# Patient Record
Sex: Female | Born: 1990 | Race: White | Hispanic: No | Smoking: Former smoker
Health system: Southern US, Community
[De-identification: ages and names within clinical notes are randomized; demographics above are authoritative.]

## PROBLEM LIST (undated history)

## (undated) DIAGNOSIS — K8020 Calculus of gallbladder without cholecystitis without obstruction: Secondary | ICD-10-CM

## (undated) DIAGNOSIS — Z8489 Family history of other specified conditions: Secondary | ICD-10-CM

## (undated) DIAGNOSIS — S39012A Strain of muscle, fascia and tendon of lower back, initial encounter: Secondary | ICD-10-CM

## (undated) HISTORY — DX: Family history of other specified conditions: Z84.89

---

## 2009-01-05 ENCOUNTER — Emergency Department (HOSPITAL_COMMUNITY): Admission: EM | Admit: 2009-01-05 | Discharge: 2009-01-05 | Payer: Self-pay | Admitting: Family Medicine

## 2009-09-30 ENCOUNTER — Emergency Department (HOSPITAL_COMMUNITY): Admission: EM | Admit: 2009-09-30 | Discharge: 2009-09-30 | Payer: Self-pay | Admitting: Emergency Medicine

## 2010-07-15 ENCOUNTER — Other Ambulatory Visit: Payer: Self-pay | Admitting: Family Medicine

## 2010-07-15 ENCOUNTER — Encounter: Payer: Self-pay | Admitting: Family Medicine

## 2010-07-15 ENCOUNTER — Other Ambulatory Visit: Payer: Self-pay | Admitting: Internal Medicine

## 2010-07-15 ENCOUNTER — Ambulatory Visit (INDEPENDENT_AMBULATORY_CARE_PROVIDER_SITE_OTHER): Payer: 59 | Admitting: Family Medicine

## 2010-07-15 DIAGNOSIS — F411 Generalized anxiety disorder: Secondary | ICD-10-CM | POA: Insufficient documentation

## 2010-07-15 DIAGNOSIS — R1013 Epigastric pain: Secondary | ICD-10-CM

## 2010-07-15 DIAGNOSIS — R634 Abnormal weight loss: Secondary | ICD-10-CM

## 2010-07-16 LAB — CBC WITH DIFFERENTIAL/PLATELET
Basophils Absolute: 0 10*3/uL (ref 0.0–0.1)
Basophils Relative: 0.4 % (ref 0.0–3.0)
Lymphs Abs: 2.4 10*3/uL (ref 0.7–4.0)
MCV: 96.7 fl (ref 78.0–100.0)
Monocytes Absolute: 0.5 10*3/uL (ref 0.1–1.0)
Monocytes Relative: 9.6 % (ref 3.0–12.0)
Neutrophils Relative %: 40.1 % — ABNORMAL LOW (ref 43.0–77.0)
Platelets: 166 10*3/uL (ref 150.0–400.0)
RBC: 3.71 Mil/uL — ABNORMAL LOW (ref 3.87–5.11)
RDW: 12.4 % (ref 11.5–14.6)
WBC: 5.3 10*3/uL (ref 4.5–10.5)

## 2010-07-16 LAB — HEPATIC FUNCTION PANEL
AST: 19 U/L (ref 0–37)
Albumin: 4.1 g/dL (ref 3.5–5.2)
Total Protein: 6.3 g/dL (ref 6.0–8.3)

## 2010-07-16 LAB — BASIC METABOLIC PANEL
Calcium: 9 mg/dL (ref 8.4–10.5)
Glucose, Bld: 78 mg/dL (ref 70–99)
Potassium: 3.6 mEq/L (ref 3.5–5.1)

## 2010-07-16 LAB — H. PYLORI ANTIBODY, IGG: H Pylori IgG: NEGATIVE

## 2010-07-17 ENCOUNTER — Encounter: Payer: Self-pay | Admitting: Family Medicine

## 2010-07-17 ENCOUNTER — Ambulatory Visit
Admission: RE | Admit: 2010-07-17 | Discharge: 2010-07-17 | Disposition: A | Discharge status: Home / Self Care | Payer: 59 | Source: Ambulatory Visit | Attending: Internal Medicine | Admitting: Internal Medicine

## 2010-07-17 ENCOUNTER — Other Ambulatory Visit: Payer: Self-pay | Admitting: Family Medicine

## 2010-07-17 DIAGNOSIS — R1013 Epigastric pain: Secondary | ICD-10-CM

## 2010-07-30 IMAGING — CR DG ABDOMEN 1V
1 series · 1 of 1 positions shown · non-contrast
Comparison: None

CLINICAL DATA: Bilateral flank pain for 1 day.  Hematuria.

ABDOMEN - 1 VIEW

[view not recorded]
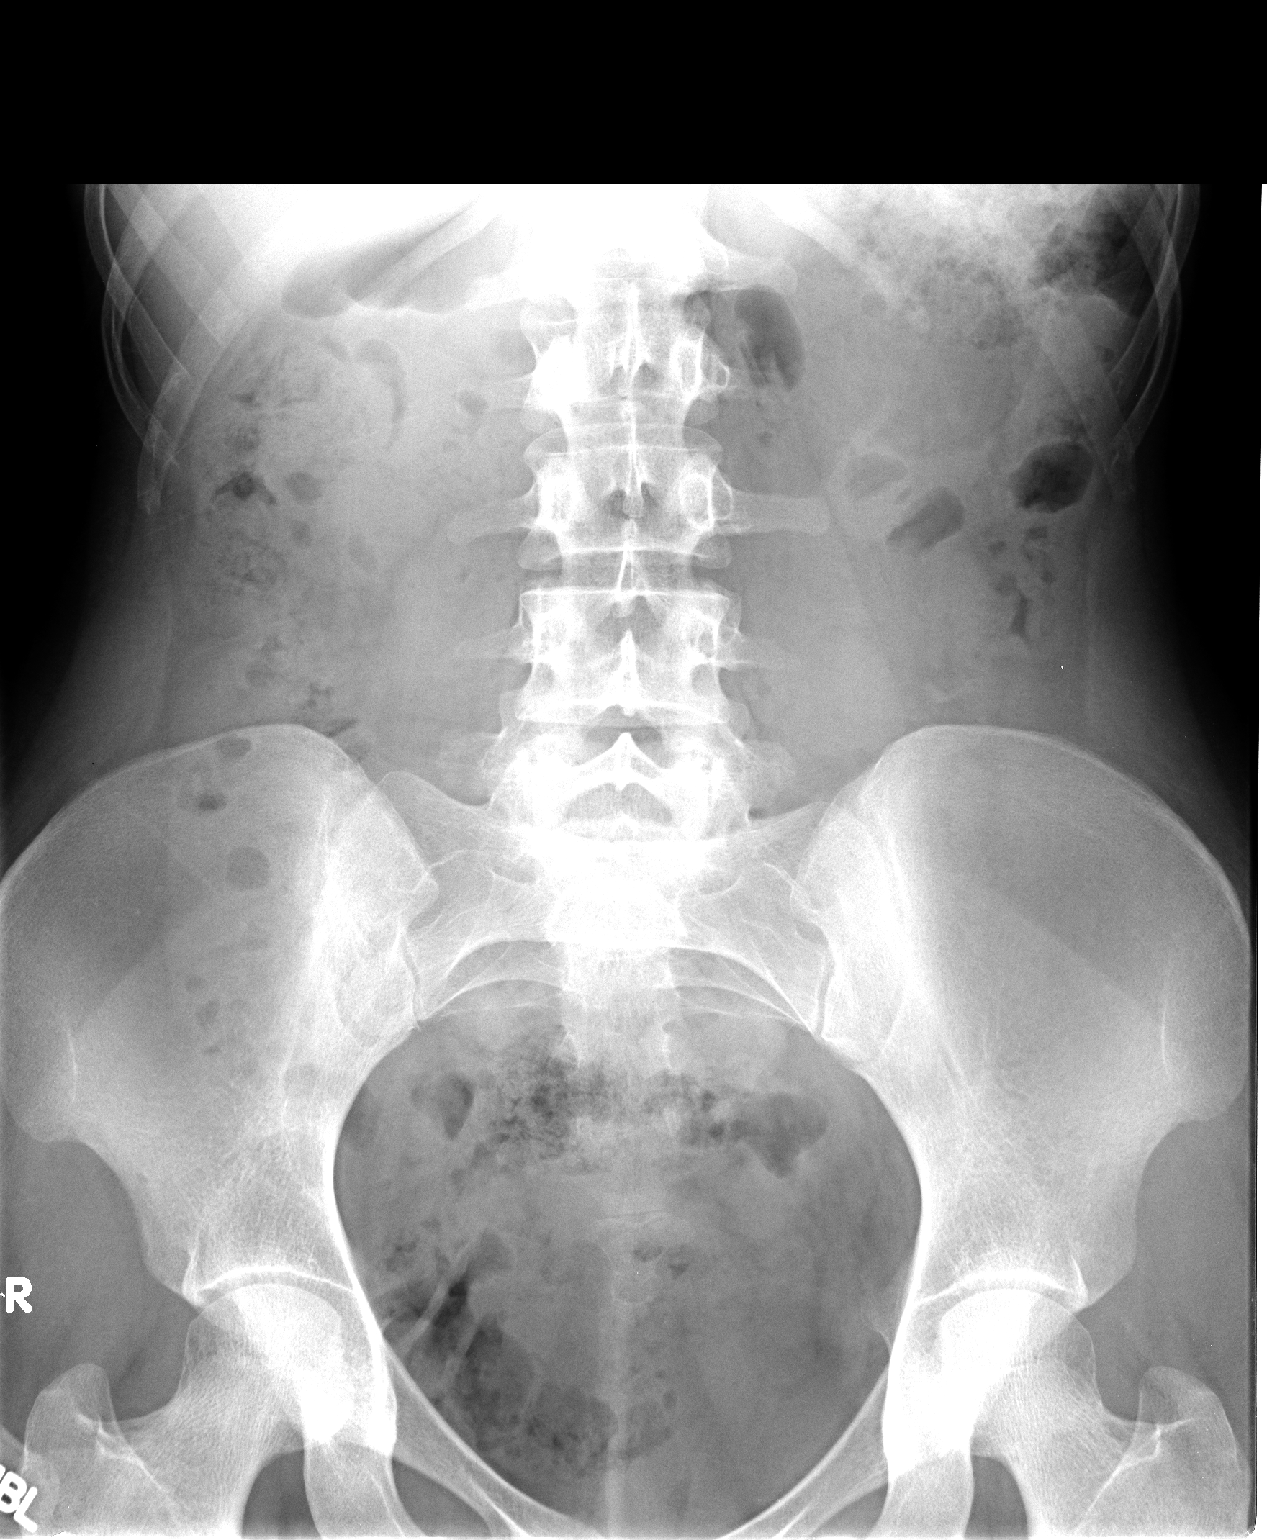

[1 of 1 positions shown; findings below may reference images not displayed]

FINDINGS: There are no visible renal or ureteral calculi.  The
bowel gas pattern is normal.  No bony abnormality.
IMPRESSION: Benign-appearing abdomen.

## 2010-07-30 NOTE — Assessment & Plan Note (Signed)
Summary: new to est/loosing weight/stomach pain/kn   Vital Signs:  Patient profile:   20 year old female Height:      66.50 inches Weight:      123 pounds BMI:     19.63 Pulse rate:   62 / minute BP sitting:   104 / 60  (left arm)  Vitals Entered By: Doristine Devoid CMA (July 15, 2010 3:33 PM) CC: NEW EST- stomach pain worse xweeks but intermittent x3-4 months some vomiting decrease appetite and weight loss    History of Present Illness: 19 yo woman here today to establish care.  previous [facility]- [facility]  reports for last year stress has been high.  for last 3-5 months is having abdominal pain under rib cage.  last week had 'an attack'- pain radiated through abd to back.  for last 2-3 weeks pain has been constant.  pain will worsen w/ anxiety.  having difficulty eating.  + nausea, vomiting in the middle of the night.  no diarrhea.  + palpitations.  has lump in throat- 'like a constant urge to vomit'.  will have 1 meal daily w/ some snacks.  was 145 lbs 4 months ago.  Preventive Screening-Counseling & Management  Alcohol-Tobacco     Alcohol drinks/day: <1     Smoking Status: current     Smoking Cessation Counseling: yes     Smoke Cessation Stage: contemplative     Packs/Day: 0.25  Caffeine-Diet-Exercise     Does Patient Exercise: no      Sexual History:  currently monogamous.        Drug Use:  marijuana.    Current Medications (verified): 1)  Buspirone Hcl 5 Mg Tabs (Buspirone Hcl) .... 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 Lungs:  Normal respiratory effort, chest expands symmetrically. Lungs are clear to auscultation, no crackles or wheezes. Heart:  Normal rate and regular rhythm. S1 and S2 normal without gallop, murmur, click, rub or other extra sounds. Abdomen:  soft, NT/ND, +BS, no rebound, guarding Pulses:  +2 carotid, radial, DP Extremities:  no C/C/E Neurologic:  alert & oriented X3, cranial nerves II-XII intact, strength normal in all extremities, sensation intact to light touch, gait normal, and DTRs symmetrical and normal.   Cervical Nodes:  No lymphadenopathy noted Psych:  Oriented X3, memory intact for recent and remote, normally interactive, good eye contact, not anxious appearing, and not depressed appearing.     Impression & Recommendations:  Problem # 1:  ABDOMINAL PAIN, EPIGASTRIC (ICD-789.06) Assessment New pt w/ epigastric abdominal pain and wt loss.  check CBC to r/o infxn, CMP to r/o electrolyte or hepatic involvement, H pylori to r/o PUD, and ESR to r/o IBD.  encouraged pt to try and eat regularly to avoid stomach being empty.  start PPI.  reviewed supportive care and red flags that should prompt return.  Pt expresses understanding and is in agreement w/ this plan. Orders: Venipuncture (78295) Radiology Referral (  Radiology) Specimen Handling ([id number]) TLB-CBC Platelet - w/Differential (85025-CBCD) TLB-BMP (Basic Metabolic Panel-BMET) (80048-METABOL) TLB-Hepatic/Liver Function Pnl (80076-HEPATIC) TLB-H. Pylori Abs(Helicobacter Pylori) (86677-HELICO) TLB-Sedimentation Rate (ESR) (85652-ESR)  Problem # 2:  WEIGHT LOSS (ICD-783.21) Assessment: New pt has 20+ lbs of unintentional weight loss.  denies eating disorder.  admits to increased stress recently.  check labs as above.  Problem # 3:  ANXIETY (ICD-300.00) Assessment: New hx of  anxiety, has difficult relationship w/ parents.  may be contributing to nausea.  will follow. Her updated medication list for this problem includes:    Buspirone Hcl 5 Mg Tabs (Buspirone hcl) .Marland Kitchen... Take one tablet daily  Complete Medication List: 1)  Buspirone Hcl 5 Mg Tabs (Buspirone hcl) .... Take one tablet daily  Patient Instructions: 1)  Please schedule a follow-up appointment in 3-4 weeks to recheck abdominal pain and weight loss. 2)  We'll notify you of your lab results 3)  Start the Nexium daily 4)  Try and eat more regularly to prevent your stomach from being empty 5)  We'll call you with your ultrasound appt 6)  Call with any questions or concerns 7)  Hang in there!    Orders Added: 1)  Venipuncture [14782] 2)  Radiology Referral [Radiology] 3)  Specimen Handling [99000] 4)  TLB-CBC Platelet - w/Differential [85025-CBCD] 5)  TLB-BMP (Basic Metabolic Panel-BMET) [80048-METABOL] 6)  TLB-Hepatic/Liver Function Pnl [80076-HEPATIC] 7)  TLB-H. Pylori Abs(Helicobacter Pylori) [86677-HELICO] 8)  TLB-Sedimentation Rate (ESR) [85652-ESR] 9)  New Patient Level III [95621]

## 2010-08-05 ENCOUNTER — Ambulatory Visit (INDEPENDENT_AMBULATORY_CARE_PROVIDER_SITE_OTHER): Payer: 59 | Admitting: Family Medicine

## 2010-08-05 ENCOUNTER — Encounter: Payer: Self-pay | Admitting: Family Medicine

## 2010-08-05 DIAGNOSIS — Z3009 Encounter for other general counseling and advice on contraception: Secondary | ICD-10-CM

## 2010-08-05 DIAGNOSIS — R1013 Epigastric pain: Secondary | ICD-10-CM

## 2010-08-05 DIAGNOSIS — R634 Abnormal weight loss: Secondary | ICD-10-CM

## 2010-08-11 NOTE — Assessment & Plan Note (Signed)
Summary: 3-4 week follow up/cbs   Vital Signs:  Patient profile:   20 year old female Weight:      127 pounds BMI:     20.26 Pulse rate:   66 / minute BP sitting:   112 / 70  (left arm)  Vitals Entered By: Doristine Devoid CMA (August 05, 2010 3:45 PM) CC: f/u nexium helps when taking w/ food    History of Present Illness: 20 yo woman here today for f/u on nausea, abdominal pain.  sxs improve when taking Nexium- will occasionaly forget.  trying to eat more regularly.  no diarrhea, vomiting.  admits to increased stress w/ school.  has gained weight since previous visit.  is making conscious effort to eat regularly.  stopped birth control a few months ago when she was having abdominal pain.  would like to restart.  was on Loestrin 24 Fe.  LMP- 2/9.  Current Medications (verified): 1)  Buspirone Hcl 5 Mg Tabs (Buspirone Hcl) .... Take One Tablet Daily  Allergies (verified): No Known Drug Allergies  Review of Systems      See HPI  Physical Exam  General:  thin, no acute distress Abdomen:  soft, NT/ND, +BS, no rebound, guarding Psych:  Oriented X3, memory intact for recent and remote, normally interactive, good eye contact, not anxious appearing, and not depressed appearing.     Impression & Recommendations:  Problem # 1:  ABDOMINAL PAIN, EPIGASTRIC (ICD-789.06) Assessment Improved better since starting PPI.  script given for Omeprazole.  Problem # 2:  WEIGHT LOSS (BMW-413.24) Assessment: Unchanged pt has gained 4 lbs- this may be clothing variation but it is encouraging that she has not continued to lose weight.  will continue to follow.  Problem # 3:  CONTRACEPTIVE MANAGEMENT (ICD-V25.09) Assessment: New script given to restart OCPs.  instructed on proper start date and importance of back up method until meds fully in pt's sxs.  Pt expresses understanding and is in agreement w/ this plan.  Complete Medication List: 1)  Buspirone Hcl 5 Mg Tabs (Buspirone hcl) .... Take  one tablet daily 2)  Omeprazole 40 Mg Cpdr (Omeprazole) .Marland Kitchen.. 1 tab by mouth daily 3)  Loestrin 24 Fe 1-20 Mg-mcg Tabs (Norethin ace-eth estrad-fe) .Marland Kitchen.. 1 tab daily as directed  Patient Instructions: 1)  Whenever your schedule lightens- schedule your physical 2)  I'm so glad you are feeling somewhat better 3)  Keep trying to eat regularly 4)  Continue the Nexium/Omeprazole daily 5)  Start the Loestrin the Sunday after you start bleeding- use condoms for at least the 1st 2 months 6)  Call with any questions or concerns 7)  Hang in there! Prescriptions: LOESTRIN 24 FE 1-20 MG-MCG TABS (NORETHIN ACE-ETH ESTRAD-FE) 1 tab daily as directed  #1 pack x 11   Entered and Authorized by:   Aaleeyah Bias MD   Signed by:   Bolden Hagerman MD on 08/05/2010   Method used:   Electronically to        [facility] [address] # [id number]* (retail)       [address]       [place], [place]  [address]       Ph: [phone]       Fax: [phone]   RxID:   [id number] OMEPRAZOLE 40 MG CPDR (OMEPRAZOLE) 1 tab by mouth daily  #30 x 3   Entered and Authorized by:   Neena Rhymes MD   Signed by:   Neena Rhymes MD on 08/05/2010   Method  used:   Electronically to        [facility] # [phone]* (retail)       [address]       [place], [place]  [address]       Ph: [phone]       Fax: [phone]   RxID:   [id number]    Orders Added: 1)  Est. Patient Level IV [51884]

## 2010-09-20 LAB — POCT URINALYSIS DIP (DEVICE)
Bilirubin Urine: NEGATIVE
Ketones, ur: NEGATIVE mg/dL
Nitrite: NEGATIVE
Protein, ur: NEGATIVE mg/dL
Urobilinogen, UA: 0.2 mg/dL (ref 0.0–1.0)

## 2010-09-20 LAB — GC/CHLAMYDIA PROBE AMP, GENITAL
Chlamydia, DNA Probe: NEGATIVE
GC Probe Amp, Genital: NEGATIVE

## 2010-09-20 LAB — WET PREP, GENITAL
Trich, Wet Prep: NONE SEEN
Yeast Wet Prep HPF POC: NONE SEEN

## 2010-09-20 LAB — POCT PREGNANCY, URINE: Preg Test, Ur: NEGATIVE
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
## 2011-07-22 ENCOUNTER — Encounter (HOSPITAL_COMMUNITY): Payer: Self-pay | Admitting: Emergency Medicine

## 2011-07-22 ENCOUNTER — Emergency Department (HOSPITAL_COMMUNITY)
Admission: EM | Admit: 2011-07-22 | Discharge: 2011-07-22 | Disposition: A | Discharge status: Home / Self Care | Payer: 59 | Source: Home / Self Care | Attending: Emergency Medicine | Admitting: Emergency Medicine

## 2011-07-22 DIAGNOSIS — S39012A Strain of muscle, fascia and tendon of lower back, initial encounter: Secondary | ICD-10-CM

## 2011-07-22 HISTORY — DX: Strain of muscle, fascia and tendon of lower back, initial encounter: S39.012A

## 2011-07-22 HISTORY — DX: Calculus of gallbladder without cholecystitis without obstruction: K80.20

## 2011-07-22 LAB — POCT URINALYSIS DIP (DEVICE)
Bilirubin Urine: NEGATIVE
Ketones, ur: NEGATIVE mg/dL
Protein, ur: NEGATIVE mg/dL
Urobilinogen, UA: 1 mg/dL (ref 0.0–1.0)

## 2011-07-22 LAB — POCT PREGNANCY, URINE: Preg Test, Ur: NEGATIVE

## 2011-07-22 MED ORDER — HYDROCODONE-ACETAMINOPHEN 5-325 MG PO TABS: 2.0000 | ORAL_TABLET | ORAL | Status: AC | PRN

## 2011-07-22 MED ORDER — PREDNISONE 20 MG PO TABS: ORAL_TABLET | ORAL | Status: AC

## 2011-07-22 MED ORDER — METHOCARBAMOL 500 MG PO TABS: 500.0000 mg | ORAL_TABLET | Freq: Four times a day (QID) | ORAL | Status: AC

## 2011-07-22 MED ORDER — IBUPROFEN 600 MG PO TABS: 600.0000 mg | ORAL_TABLET | Freq: Four times a day (QID) | ORAL | Status: AC | PRN

## 2011-07-22 NOTE — ED Notes (Signed)
PT HERE WITH SEVERE CONSTANT SHARP LOWER BACK PAIN,NONRADIATING SINCE THIS AM.PT STATES I WENT TO BEND DOWN AND COULDN'T GET UP.NO INJURY BUT PT HAS HX GALLSTONES.URINE SPECIMEN COLLECTED.VSS.PT IS CURRENTLY ON MENSTRUAL
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
## 2016-06-16 ENCOUNTER — Emergency Department (HOSPITAL_COMMUNITY)
Admission: EM | Admit: 2016-06-16 | Discharge: 2016-06-16 | Disposition: A | Discharge status: Home / Self Care | Payer: Self-pay | Attending: Emergency Medicine | Admitting: Emergency Medicine

## 2016-06-16 ENCOUNTER — Emergency Department (HOSPITAL_COMMUNITY): Payer: Self-pay

## 2016-06-16 ENCOUNTER — Encounter (HOSPITAL_COMMUNITY): Payer: Self-pay

## 2016-06-16 DIAGNOSIS — R102 Pelvic and perineal pain: Secondary | ICD-10-CM | POA: Insufficient documentation

## 2016-06-16 DIAGNOSIS — F17210 Nicotine dependence, cigarettes, uncomplicated: Secondary | ICD-10-CM | POA: Insufficient documentation

## 2016-06-16 LAB — URINALYSIS, ROUTINE W REFLEX MICROSCOPIC
BILIRUBIN URINE: NEGATIVE
GLUCOSE, UA: NEGATIVE mg/dL
Ketones, ur: NEGATIVE mg/dL
Leukocytes, UA: NEGATIVE
NITRITE: NEGATIVE
PH: 7 (ref 5.0–8.0)
Protein, ur: NEGATIVE mg/dL
SPECIFIC GRAVITY, URINE: 1.003 — AB (ref 1.005–1.030)

## 2016-06-16 LAB — WET PREP, GENITAL
CLUE CELLS WET PREP: NONE SEEN
SPERM: NONE SEEN
TRICH WET PREP: NONE SEEN
Yeast Wet Prep HPF POC: NONE SEEN

## 2016-06-16 LAB — COMPREHENSIVE METABOLIC PANEL
ALT: 16 U/L (ref 14–54)
AST: 18 U/L (ref 15–41)
Albumin: 4.4 g/dL (ref 3.5–5.0)
Alkaline Phosphatase: 50 U/L (ref 38–126)
Anion gap: 7 (ref 5–15)
BUN: 9 mg/dL (ref 6–20)
CHLORIDE: 107 mmol/L (ref 101–111)
CO2: 24 mmol/L (ref 22–32)
Calcium: 9.1 mg/dL (ref 8.9–10.3)
Creatinine, Ser: 0.58 mg/dL (ref 0.44–1.00)
Glucose, Bld: 107 mg/dL — ABNORMAL HIGH (ref 65–99)
POTASSIUM: 3.8 mmol/L (ref 3.5–5.1)
SODIUM: 138 mmol/L (ref 135–145)
Total Bilirubin: 0.6 mg/dL (ref 0.3–1.2)
Total Protein: 7.1 g/dL (ref 6.5–8.1)

## 2016-06-16 LAB — CBC
HCT: 41.2 % (ref 36.0–46.0)
HEMOGLOBIN: 14 g/dL (ref 12.0–15.0)
MCH: 32.7 pg (ref 26.0–34.0)
MCHC: 34 g/dL (ref 30.0–36.0)
MCV: 96.3 fL (ref 78.0–100.0)
PLATELETS: 289 10*3/uL (ref 150–400)
RBC: 4.28 MIL/uL (ref 3.87–5.11)
RDW: 12.5 % (ref 11.5–15.5)
WBC: 9.3 10*3/uL (ref 4.0–10.5)

## 2016-06-16 LAB — I-STAT BETA HCG BLOOD, ED (MC, WL, AP ONLY)

## 2016-06-16 LAB — LIPASE, BLOOD: LIPASE: 24 U/L (ref 11–51)

## 2016-06-16 MED ORDER — KETOROLAC TROMETHAMINE 30 MG/ML IJ SOLN
30.0000 mg | Freq: Once | INTRAMUSCULAR | Status: AC
  Administered 2016-06-16: 30 mg via INTRAVENOUS
  Filled 2016-06-16: qty 1

## 2016-06-16 MED ORDER — MORPHINE SULFATE (PF) 2 MG/ML IV SOLN
2.0000 mg | Freq: Once | INTRAVENOUS | Status: AC
Start: 2016-06-16 — End: 2016-06-16
  Administered 2016-06-16: 2 mg via INTRAVENOUS
  Filled 2016-06-16: qty 1

## 2016-06-16 MED ORDER — ONDANSETRON HCL 4 MG/2ML IJ SOLN
4.0000 mg | Freq: Once | INTRAMUSCULAR | Status: AC
  Administered 2016-06-16: 4 mg via INTRAVENOUS
  Filled 2016-06-16: qty 2

## 2016-06-16 NOTE — ED Notes (Addendum)
EDPA Provider at bedside. PELVIC. ASSISTED BY Mardene CelesteJOANNA NT

## 2016-06-16 NOTE — Discharge Instructions (Signed)
For pain control please take ibuprofen (also known as Motrin or Advil) 800mg  (this is normally 4 over the counter pills) 3 times a day  for 5 days. Take with food to minimize stomach irritation.  Do not hesitate to return to the emergency department for any new, worsening or concerning symptoms.

## 2016-06-16 NOTE — ED Notes (Signed)
EDPA Provider at bedside. 

## 2016-06-16 NOTE — ED Triage Notes (Signed)
Pt c/o LLQ pain, nausea, and increased "urge to have a BM" starting yesterday.  Pain score 6/10.  Last BM this morning and currently on menstrual period.  Sts "I typically have bad cramping, but this feels different."  Denies GU complaints.

## 2016-06-16 NOTE — ED Notes (Signed)
RETURNED FROM ULTRASOUND
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
## 2016-06-17 LAB — GC/CHLAMYDIA PROBE AMP (~~LOC~~) NOT AT ARMC
CHLAMYDIA, DNA PROBE: NEGATIVE
Neisseria Gonorrhea: NEGATIVE

## 2016-06-17 LAB — HIV ANTIBODY (ROUTINE TESTING W REFLEX): HIV Screen 4th Generation wRfx: NONREACTIVE

## 2016-06-17 LAB — RPR: RPR Ser Ql: NONREACTIVE

## 2016-06-22 ENCOUNTER — Other Ambulatory Visit: Payer: Self-pay | Admitting: Obstetrics & Gynecology

## 2017-01-27 IMAGING — US US TRANSVAGINAL NON-OB
1 series · 13 of 25 positions shown · non-contrast
Comparison: None.

CLINICAL DATA: Pelvic pain.

EXAM:
TRANSABDOMINAL AND TRANSVAGINAL ULTRASOUND OF PELVIS
DOPPLER ULTRASOUND OF OVARIES
TECHNIQUE: Both transabdominal and transvaginal ultrasound examinations of the
pelvis were performed. Transabdominal technique was performed for
global imaging of the pelvis including uterus, ovaries, adnexal
regions, and pelvic cul-de-sac.
It was necessary to proceed with endovaginal exam following the
transabdominal exam to visualize the endometrium and ovaries. Color
and duplex Doppler ultrasound was utilized to evaluate blood flow to
the ovaries.

[Series 1: us transvaginal non-ob · 0.24mm/px · 13 of 90 slices shown]
[im 1/90]
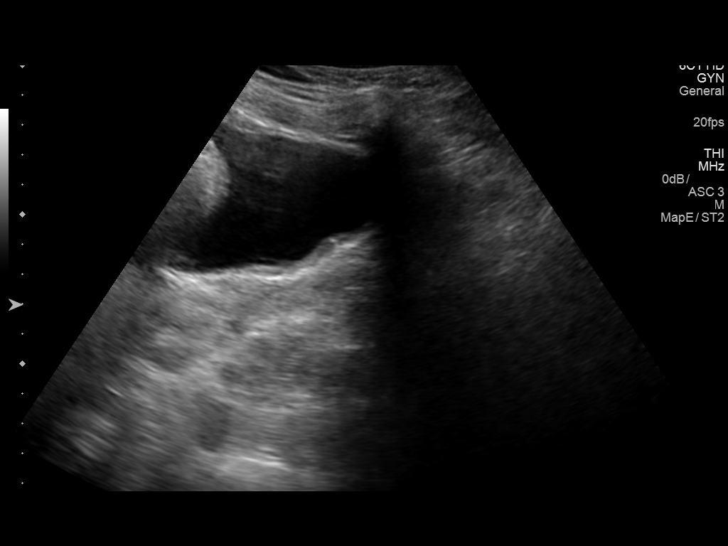
[im 8/90]
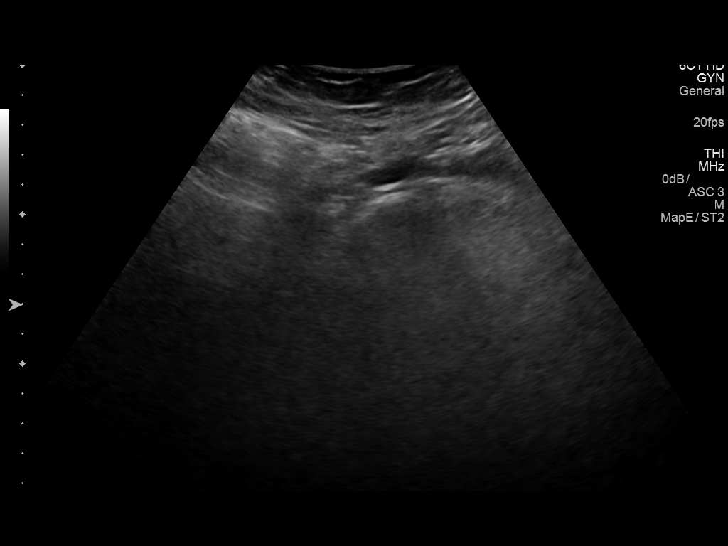
[im 15/90]
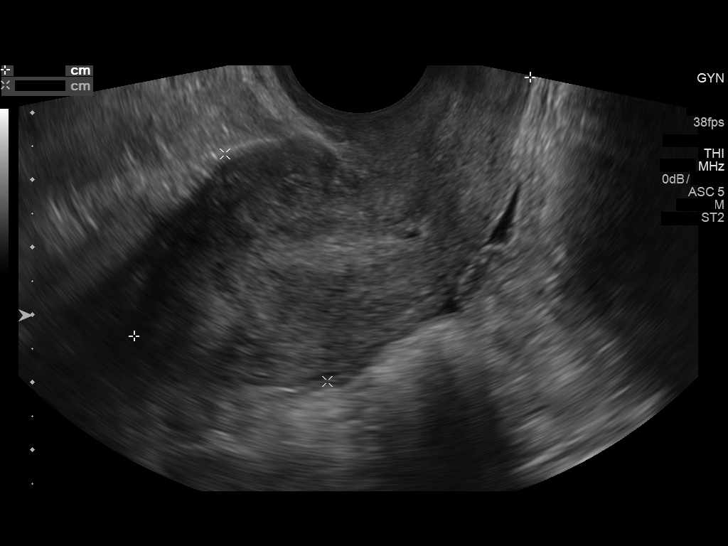
[im 23/90]
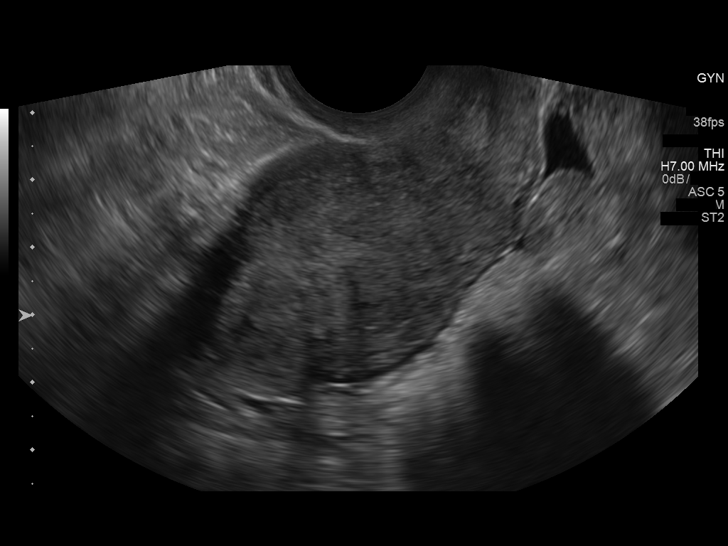
[im 30/90]
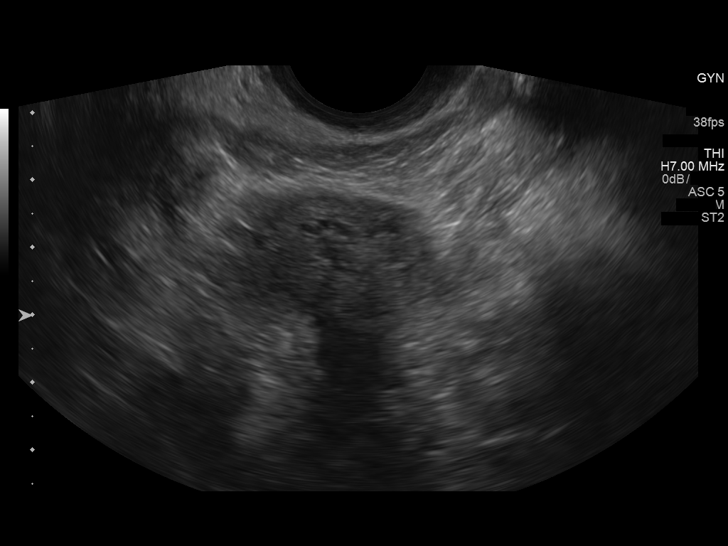
[im 38/90]
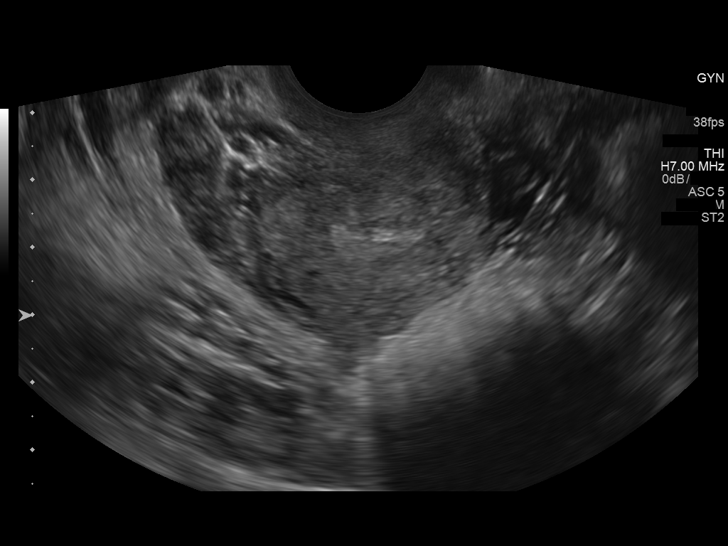
[im 45/90]
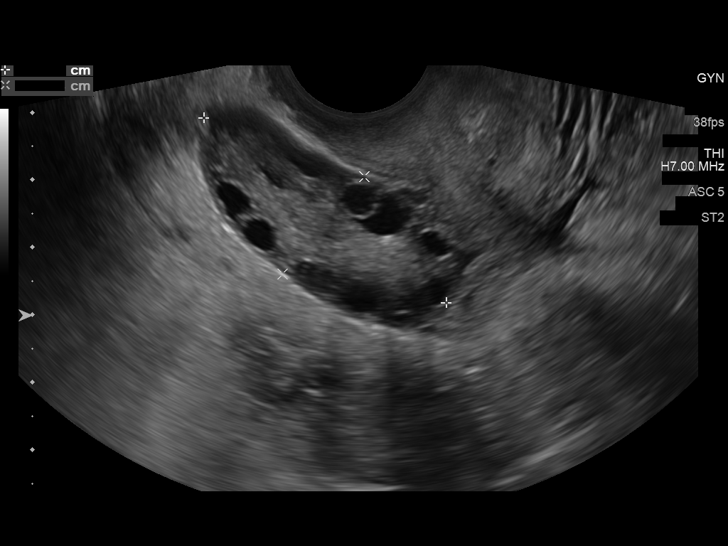
[im 52/90]
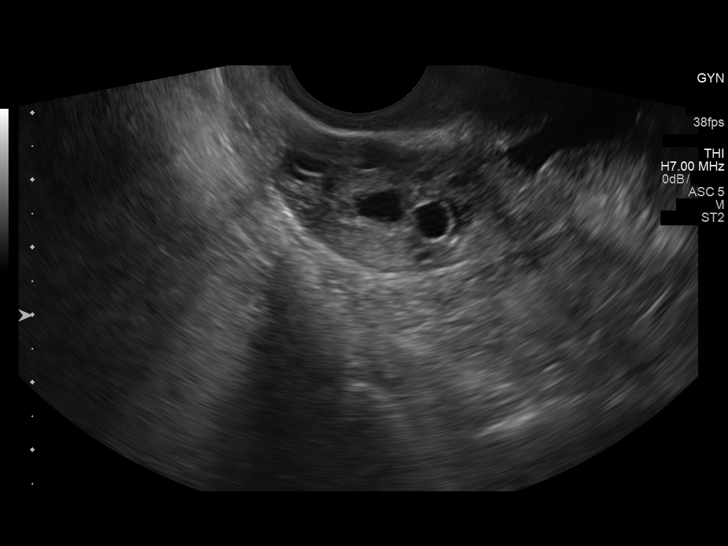
[im 60/90]
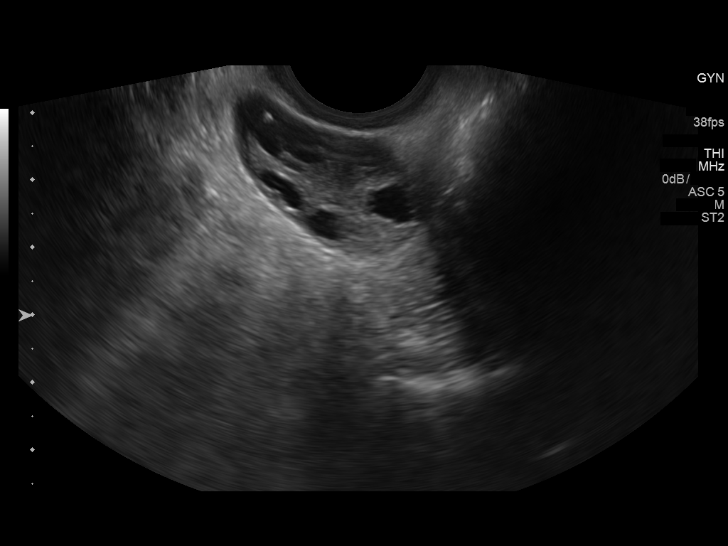
[im 67/90]
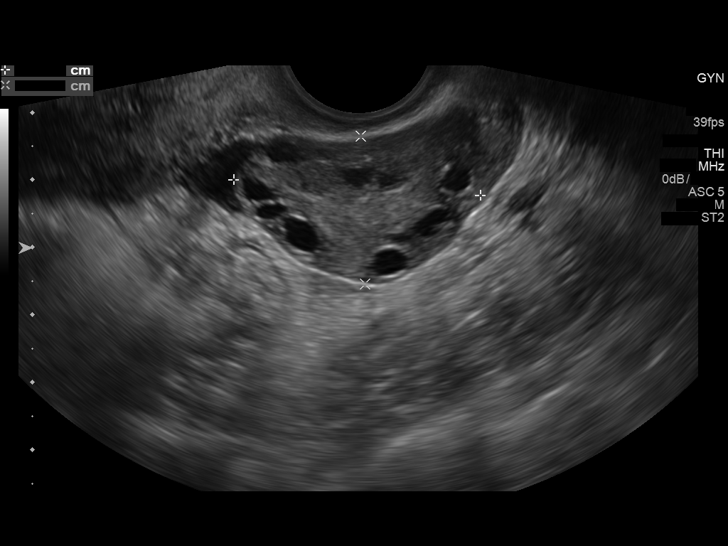
[im 75/90]
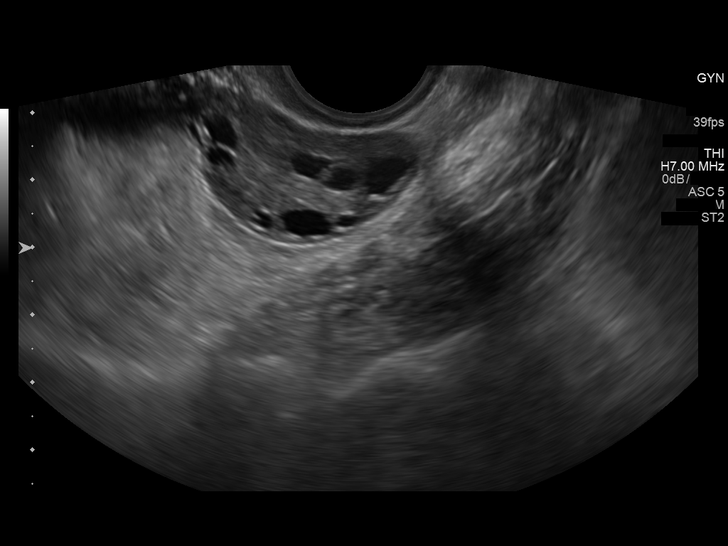
[im 82/90]
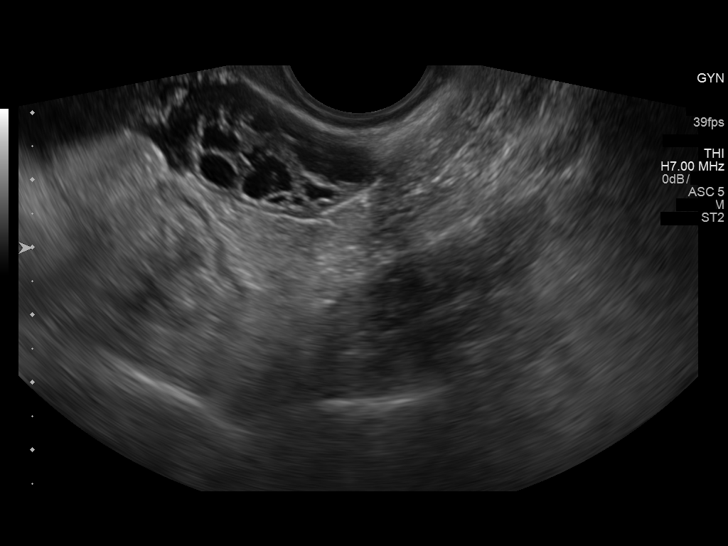
[im 90/90]
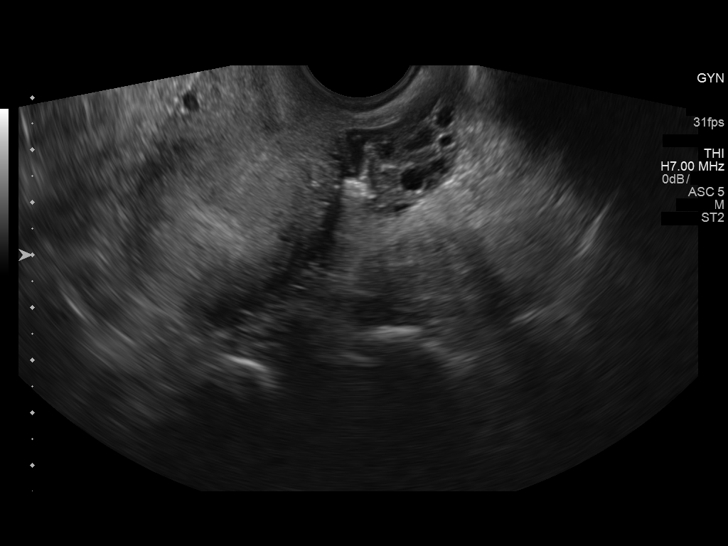

[13 of 25 positions shown; findings below may reference images not displayed]

FINDINGS: Uterus

Measurements: 7 x 3.7 x 4.4 cm. 1.6 x 2 x 2.1 cm heterogeneous
uterine fundal mass most consistent with a uterine leiomyoma.

Endometrium

Thickness: 4.7 mm.  No focal abnormality visualized.

Right ovary

Measurements: 4.5 x 1.9 x 2.3 cm. Normal appearance/no adnexal mass.

Left ovary

Measurements: 3.7 x 2.2 x 1.9 cm. Normal appearance/no adnexal mass.

Pulsed Doppler evaluation of both ovaries demonstrates normal
low-resistance arterial and venous waveforms.

Other findings

Small amount of pelvic free fluid likely physiologic.
IMPRESSION: 1. No evidence of ovarian torsion.
2. Small uterine fibroid.

## 2019-06-13 LAB — OB RESULTS CONSOLE ABO/RH: RH Type: POSITIVE

## 2019-06-13 LAB — OB RESULTS CONSOLE RPR: RPR: NONREACTIVE

## 2019-06-13 LAB — OB RESULTS CONSOLE ANTIBODY SCREEN: Antibody Screen: NEGATIVE

## 2019-06-13 LAB — OB RESULTS CONSOLE GC/CHLAMYDIA
Chlamydia: POSITIVE
Gonorrhea: NEGATIVE

## 2019-06-13 LAB — OB RESULTS CONSOLE HEPATITIS B SURFACE ANTIGEN: Hepatitis B Surface Ag: NEGATIVE

## 2019-06-13 LAB — OB RESULTS CONSOLE RUBELLA ANTIBODY, IGM: Rubella: IMMUNE

## 2019-06-13 LAB — OB RESULTS CONSOLE HIV ANTIBODY (ROUTINE TESTING): HIV: NONREACTIVE

## 2019-06-15 NOTE — L&D Delivery Note (Signed)
Patient was C/C/+2 and pushed for 24 minutes with epidural.    NSVD  female infant, Apgars 7,9, weight P.    <30 second shoulder dystocia relieved with suprapubic pressure and McRoberts.  The patient had bilateral labial and second degree perineal lacerations repaired with 3-0 and 2-0 Vicryl R. . Fundus was firm. EBL was expected amount. Placenta was delivered intact. Vagina was clear.  Delayed cord clamping done for 30-60 seconds while warming baby. Baby was vigorous and doing skin to skin with mother.  Loney Laurence

## 2019-07-11 LAB — OB RESULTS CONSOLE GC/CHLAMYDIA: Chlamydia: NEGATIVE

## 2019-12-04 LAB — OB RESULTS CONSOLE GBS: GBS: NEGATIVE

## 2019-12-31 ENCOUNTER — Telehealth (HOSPITAL_COMMUNITY): Payer: Self-pay | Admitting: *Deleted

## 2019-12-31 ENCOUNTER — Encounter (HOSPITAL_COMMUNITY): Payer: Self-pay | Admitting: *Deleted

## 2019-12-31 NOTE — Telephone Encounter (Signed)
Preadmission screen  

## 2020-01-01 ENCOUNTER — Other Ambulatory Visit: Payer: Self-pay | Admitting: Obstetrics and Gynecology

## 2020-01-01 ENCOUNTER — Encounter (HOSPITAL_COMMUNITY): Payer: Self-pay | Admitting: *Deleted

## 2020-01-01 ENCOUNTER — Telehealth (HOSPITAL_COMMUNITY): Payer: Self-pay | Admitting: *Deleted

## 2020-01-01 NOTE — Telephone Encounter (Signed)
Preadmission screen  

## 2020-01-02 ENCOUNTER — Other Ambulatory Visit (HOSPITAL_COMMUNITY)
Admission: RE | Admit: 2020-01-02 | Discharge: 2020-01-02 | Disposition: A | Discharge status: Home / Self Care | Payer: BLUE CROSS/BLUE SHIELD | Source: Ambulatory Visit | Attending: Obstetrics and Gynecology | Admitting: Obstetrics and Gynecology

## 2020-01-02 DIAGNOSIS — Z01812 Encounter for preprocedural laboratory examination: Secondary | ICD-10-CM | POA: Insufficient documentation

## 2020-01-02 DIAGNOSIS — Z20822 Contact with and (suspected) exposure to covid-19: Secondary | ICD-10-CM | POA: Insufficient documentation

## 2020-01-02 LAB — SARS CORONAVIRUS 2 (TAT 6-24 HRS): SARS Coronavirus 2: NEGATIVE

## 2020-01-04 ENCOUNTER — Inpatient Hospital Stay (HOSPITAL_COMMUNITY)
Admission: RE | Admit: 2020-01-04 | Discharge: 2020-01-05 | DRG: 807 | Disposition: A | Discharge status: Home / Self Care | Payer: BLUE CROSS/BLUE SHIELD | Attending: Obstetrics and Gynecology | Admitting: Obstetrics and Gynecology

## 2020-01-04 ENCOUNTER — Encounter (HOSPITAL_COMMUNITY): Payer: Self-pay | Admitting: Obstetrics and Gynecology

## 2020-01-04 ENCOUNTER — Other Ambulatory Visit: Payer: Self-pay

## 2020-01-04 ENCOUNTER — Inpatient Hospital Stay (HOSPITAL_COMMUNITY): Payer: BLUE CROSS/BLUE SHIELD | Admitting: Anesthesiology

## 2020-01-04 ENCOUNTER — Inpatient Hospital Stay (HOSPITAL_COMMUNITY): Payer: BLUE CROSS/BLUE SHIELD

## 2020-01-04 DIAGNOSIS — O480 Post-term pregnancy: Secondary | ICD-10-CM | POA: Diagnosis present

## 2020-01-04 DIAGNOSIS — Z87891 Personal history of nicotine dependence: Secondary | ICD-10-CM | POA: Diagnosis not present

## 2020-01-04 DIAGNOSIS — Z3A41 41 weeks gestation of pregnancy: Secondary | ICD-10-CM | POA: Diagnosis not present

## 2020-01-04 DIAGNOSIS — Z20822 Contact with and (suspected) exposure to covid-19: Secondary | ICD-10-CM | POA: Diagnosis present

## 2020-01-04 LAB — CBC
HCT: 36.4 % (ref 36.0–46.0)
Hemoglobin: 12 g/dL (ref 12.0–15.0)
MCH: 33.2 pg (ref 26.0–34.0)
MCHC: 33 g/dL (ref 30.0–36.0)
MCV: 100.8 fL — ABNORMAL HIGH (ref 80.0–100.0)
Platelets: 272 10*3/uL (ref 150–400)
RBC: 3.61 MIL/uL — ABNORMAL LOW (ref 3.87–5.11)
RDW: 12.9 % (ref 11.5–15.5)
WBC: 14.7 10*3/uL — ABNORMAL HIGH (ref 4.0–10.5)
nRBC: 0 % (ref 0.0–0.2)

## 2020-01-04 LAB — TYPE AND SCREEN
ABO/RH(D): A POS
Antibody Screen: NEGATIVE

## 2020-01-04 LAB — ABO/RH: ABO/RH(D): A POS

## 2020-01-04 LAB — RPR: RPR Ser Ql: NONREACTIVE

## 2020-01-04 MED ORDER — OXYCODONE-ACETAMINOPHEN 5-325 MG PO TABS: 2.0000 | ORAL_TABLET | ORAL | Status: DC | PRN

## 2020-01-04 MED ORDER — SODIUM CHLORIDE (PF) 0.9 % IJ SOLN
INTRAMUSCULAR | Status: DC | PRN
  Administered 2020-01-04: 12 mL/h via EPIDURAL

## 2020-01-04 MED ORDER — ACETAMINOPHEN 325 MG PO TABS
650.0000 mg | ORAL_TABLET | ORAL | Status: DC | PRN
  Administered 2020-01-05 (×3): 650 mg via ORAL
  Filled 2020-01-04 (×3): qty 2

## 2020-01-04 MED ORDER — EPHEDRINE 5 MG/ML INJ: 10.0000 mg | INTRAVENOUS | Status: DC | PRN

## 2020-01-04 MED ORDER — SODIUM CHLORIDE 0.9% FLUSH: 3.0000 mL | INTRAVENOUS | Status: DC | PRN

## 2020-01-04 MED ORDER — LIDOCAINE HCL (PF) 1 % IJ SOLN: 30.0000 mL | INTRAMUSCULAR | Status: DC | PRN

## 2020-01-04 MED ORDER — FENTANYL-BUPIVACAINE-NACL 0.5-0.125-0.9 MG/250ML-% EP SOLN
12.0000 mL/h | EPIDURAL | Status: DC | PRN
  Filled 2020-01-04: qty 250

## 2020-01-04 MED ORDER — PRENATAL MULTIVITAMIN CH
1.0000 | ORAL_TABLET | Freq: Every day | ORAL | Status: DC
  Filled 2020-01-04: qty 1

## 2020-01-04 MED ORDER — OXYTOCIN-SODIUM CHLORIDE 30-0.9 UT/500ML-% IV SOLN: 2.5000 [IU]/h | INTRAVENOUS | Status: DC

## 2020-01-04 MED ORDER — ZOLPIDEM TARTRATE 5 MG PO TABS: 5.0000 mg | ORAL_TABLET | Freq: Every evening | ORAL | Status: DC | PRN

## 2020-01-04 MED ORDER — BENZOCAINE-MENTHOL 20-0.5 % EX AERO
1.0000 "application " | INHALATION_SPRAY | CUTANEOUS | Status: DC | PRN
  Filled 2020-01-04: qty 56

## 2020-01-04 MED ORDER — LIDOCAINE HCL (PF) 1 % IJ SOLN
INTRAMUSCULAR | Status: DC | PRN
  Administered 2020-01-04 (×2): 4 mL via EPIDURAL

## 2020-01-04 MED ORDER — MEASLES, MUMPS & RUBELLA VAC IJ SOLR: 0.5000 mL | Freq: Once | INTRAMUSCULAR | Status: DC

## 2020-01-04 MED ORDER — LACTATED RINGERS IV SOLN: INTRAVENOUS | Status: DC

## 2020-01-04 MED ORDER — PHENYLEPHRINE 40 MCG/ML (10ML) SYRINGE FOR IV PUSH (FOR BLOOD PRESSURE SUPPORT): 80.0000 ug | PREFILLED_SYRINGE | INTRAVENOUS | Status: DC | PRN

## 2020-01-04 MED ORDER — TETANUS-DIPHTH-ACELL PERTUSSIS 5-2.5-18.5 LF-MCG/0.5 IM SUSP: 0.5000 mL | Freq: Once | INTRAMUSCULAR | Status: DC

## 2020-01-04 MED ORDER — LACTATED RINGERS IV SOLN: 500.0000 mL | INTRAVENOUS | Status: DC | PRN

## 2020-01-04 MED ORDER — OXYTOCIN BOLUS FROM INFUSION
333.0000 mL | Freq: Once | INTRAVENOUS | Status: AC
  Administered 2020-01-04: 333 mL via INTRAVENOUS

## 2020-01-04 MED ORDER — LORATADINE 10 MG PO TABS: 10.0000 mg | ORAL_TABLET | Freq: Every day | ORAL | Status: DC

## 2020-01-04 MED ORDER — DIPHENHYDRAMINE HCL 50 MG/ML IJ SOLN: 12.5000 mg | INTRAMUSCULAR | Status: DC | PRN

## 2020-01-04 MED ORDER — LACTATED RINGERS IV SOLN
500.0000 mL | Freq: Once | INTRAVENOUS | Status: AC
  Administered 2020-01-04: 500 mL via INTRAVENOUS

## 2020-01-04 MED ORDER — FLEET ENEMA 7-19 GM/118ML RE ENEM: 1.0000 | ENEMA | RECTAL | Status: DC | PRN

## 2020-01-04 MED ORDER — OXYTOCIN-SODIUM CHLORIDE 30-0.9 UT/500ML-% IV SOLN
1.0000 m[IU]/min | INTRAVENOUS | Status: DC
  Administered 2020-01-04: 2 m[IU]/min via INTRAVENOUS
  Filled 2020-01-04: qty 500

## 2020-01-04 MED ORDER — METHYLERGONOVINE MALEATE 0.2 MG PO TABS: 0.2000 mg | ORAL_TABLET | ORAL | Status: DC | PRN

## 2020-01-04 MED ORDER — MAGNESIUM HYDROXIDE 400 MG/5ML PO SUSP: 30.0000 mL | ORAL | Status: DC | PRN

## 2020-01-04 MED ORDER — SOD CITRATE-CITRIC ACID 500-334 MG/5ML PO SOLN
30.0000 mL | ORAL | Status: DC | PRN
  Administered 2020-01-04: 30 mL via ORAL
  Filled 2020-01-04: qty 30

## 2020-01-04 MED ORDER — SENNOSIDES-DOCUSATE SODIUM 8.6-50 MG PO TABS
2.0000 | ORAL_TABLET | ORAL | Status: DC
  Administered 2020-01-04: 2 via ORAL
  Filled 2020-01-04: qty 2

## 2020-01-04 MED ORDER — IBUPROFEN 800 MG PO TABS
800.0000 mg | ORAL_TABLET | Freq: Three times a day (TID) | ORAL | Status: DC
  Administered 2020-01-04 – 2020-01-05 (×3): 800 mg via ORAL
  Filled 2020-01-04 (×3): qty 1

## 2020-01-04 MED ORDER — COCONUT OIL OIL: 1.0000 "application " | TOPICAL_OIL | Status: DC | PRN

## 2020-01-04 MED ORDER — FERROUS SULFATE 325 (65 FE) MG PO TABS
325.0000 mg | ORAL_TABLET | Freq: Two times a day (BID) | ORAL | Status: DC
  Administered 2020-01-05 (×2): 325 mg via ORAL
  Filled 2020-01-04 (×2): qty 1

## 2020-01-04 MED ORDER — FLUTICASONE PROPIONATE 50 MCG/ACT NA SUSP
1.0000 | Freq: Every day | NASAL | Status: DC
  Filled 2020-01-04: qty 16

## 2020-01-04 MED ORDER — DIPHENHYDRAMINE HCL 25 MG PO CAPS: 25.0000 mg | ORAL_CAPSULE | Freq: Four times a day (QID) | ORAL | Status: DC | PRN

## 2020-01-04 MED ORDER — ONDANSETRON HCL 4 MG/2ML IJ SOLN
4.0000 mg | Freq: Four times a day (QID) | INTRAMUSCULAR | Status: DC | PRN
  Administered 2020-01-04: 4 mg via INTRAVENOUS
  Filled 2020-01-04: qty 2

## 2020-01-04 MED ORDER — TERBUTALINE SULFATE 1 MG/ML IJ SOLN: 0.2500 mg | Freq: Once | INTRAMUSCULAR | Status: DC | PRN

## 2020-01-04 MED ORDER — SODIUM CHLORIDE 0.9 % IV SOLN: 250.0000 mL | INTRAVENOUS | Status: DC | PRN

## 2020-01-04 MED ORDER — ONDANSETRON HCL 4 MG PO TABS: 4.0000 mg | ORAL_TABLET | ORAL | Status: DC | PRN

## 2020-01-04 MED ORDER — SIMETHICONE 80 MG PO CHEW: 80.0000 mg | CHEWABLE_TABLET | ORAL | Status: DC | PRN

## 2020-01-04 MED ORDER — ONDANSETRON HCL 4 MG/2ML IJ SOLN: 4.0000 mg | INTRAMUSCULAR | Status: DC | PRN

## 2020-01-04 MED ORDER — DIBUCAINE (PERIANAL) 1 % EX OINT: 1.0000 "application " | TOPICAL_OINTMENT | CUTANEOUS | Status: DC | PRN

## 2020-01-04 MED ORDER — WITCH HAZEL-GLYCERIN EX PADS: 1.0000 "application " | MEDICATED_PAD | CUTANEOUS | Status: DC | PRN

## 2020-01-04 MED ORDER — LACTATED RINGERS IV SOLN: 500.0000 mL | Freq: Once | INTRAVENOUS | Status: DC

## 2020-01-04 MED ORDER — OXYCODONE-ACETAMINOPHEN 5-325 MG PO TABS: 1.0000 | ORAL_TABLET | ORAL | Status: DC | PRN

## 2020-01-04 MED ORDER — METHYLERGONOVINE MALEATE 0.2 MG/ML IJ SOLN: 0.2000 mg | INTRAMUSCULAR | Status: DC | PRN

## 2020-01-04 MED ORDER — ACETAMINOPHEN 325 MG PO TABS: 650.0000 mg | ORAL_TABLET | ORAL | Status: DC | PRN

## 2020-01-04 MED ORDER — PANTOPRAZOLE SODIUM 40 MG PO TBEC
40.0000 mg | DELAYED_RELEASE_TABLET | Freq: Every day | ORAL | Status: DC
  Administered 2020-01-05: 40 mg via ORAL
  Filled 2020-01-04: qty 1

## 2020-01-04 MED ORDER — SODIUM CHLORIDE 0.9% FLUSH
3.0000 mL | Freq: Two times a day (BID) | INTRAVENOUS | Status: DC
  Administered 2020-01-04: 3 mL via INTRAVENOUS
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
## 2020-01-04 NOTE — Anesthesia Procedure Notes (Signed)
Epidural Patient location during procedure: OB Start time: 01/04/2020 10:38 AM End time: 01/04/2020 10:41 AM  Staffing Anesthesiologist: Kaylyn Layer, MD Performed: anesthesiologist   Preanesthetic Checklist Completed: patient identified, IV checked, risks and benefits discussed, monitors and equipment checked, pre-op evaluation and timeout performed  Epidural Patient position: sitting Prep: DuraPrep and site prepped and draped Patient monitoring: continuous pulse ox, blood pressure and heart rate Approach: midline Location: L3-L4 Injection technique: LOR air  Needle:  Needle type: Tuohy  Needle gauge: 17 G Needle length: 9 cm Needle insertion depth: 7 cm Catheter type: closed end flexible Catheter size: 19 Gauge Catheter at skin depth: 12 cm Test dose: negative and Other (1% lidocaine)  Assessment Events: blood not aspirated, injection not painful, no injection resistance, no paresthesia and negative IV test  Additional Notes Patient identified. Risks, benefits, and alternatives discussed with patient including but not limited to bleeding, infection, nerve damage, paralysis, failed block, incomplete pain control, headache, blood pressure changes, nausea, vomiting, reactions to medication, itching, and postpartum back pain. Confirmed with bedside nurse the patient's most recent platelet count. Confirmed with patient that they are not currently taking any anticoagulation, have any bleeding history, or any family history of bleeding disorders. Patient expressed understanding and wished to proceed. All questions were answered. Sterile technique was used throughout the entire procedure. Please see nursing notes for vital signs.   Crisp LOR on first pass. Test dose was given through epidural catheter and negative prior to continuing to dose epidural or start infusion. Warning signs of high block given to the patient including shortness of breath, tingling/numbness in hands, complete  motor block, or any concerning symptoms with instructions to call for help. Patient was given instructions on fall risk and not to get out of bed. All questions and concerns addressed with instructions to call with any issues or inadequate analgesia.  Reason for block:procedure for pain

## 2020-01-04 NOTE — Anesthesia Preprocedure Evaluation (Signed)
Anesthesia Evaluation  Patient identified by MRN, date of birth, ID band Patient awake    Reviewed: Allergy & Precautions, Patient's Chart, lab work & pertinent test results  History of Anesthesia Complications Negative for: history of anesthetic complications  Airway Mallampati: II  TM Distance: >3 FB Neck ROM: Full    Dental no notable dental hx.    Pulmonary former smoker,    Pulmonary exam normal        Cardiovascular negative cardio ROS Normal cardiovascular exam     Neuro/Psych negative neurological ROS  negative psych ROS   GI/Hepatic Neg liver ROS, GERD  Medicated and Controlled,  Endo/Other  negative endocrine ROS  Renal/GU negative Renal ROS  negative genitourinary   Musculoskeletal negative musculoskeletal ROS (+)   Abdominal   Peds  Hematology negative hematology ROS (+)   Anesthesia Other Findings Day of surgery medications reviewed with patient.  Reproductive/Obstetrics (+) Pregnancy                             Anesthesia Physical Anesthesia Plan  ASA: II  Anesthesia Plan: Epidural   Post-op Pain Management:    Induction:   PONV Risk Score and Plan: Treatment may vary due to age or medical condition  Airway Management Planned: Natural Airway  Additional Equipment:   Intra-op Plan:   Post-operative Plan:   Informed Consent: I have reviewed the patients History and Physical, chart, labs and discussed the procedure including the risks, benefits and alternatives for the proposed anesthesia with the patient or authorized representative who has indicated his/her understanding and acceptance.       Plan Discussed with:   Anesthesia Plan Comments:         Anesthesia Quick Evaluation

## 2020-01-04 NOTE — Lactation Note (Signed)
This note was copied from a baby's chart. Lactation Consultation Note  Patient Name: Nichole Scott [id number] Date: 01/04/2020 Reason for consult: Initial assessment;1st time breastfeeding;Term P1, 4 hour term female infant. Per mom, she feels BF is going well infant latched twice in L&D 1st time-23 minutes , 2nd time -30 minutes. Infant had one stool since delivery. Mom has Medela DEBP at home. Mom latched infant on her right breast STS when LC was in the room, mom used the football hold position, infant latched with nose and chin touching breast and top lip was flanged outward, swallows observed. Infant was still BF after 11 minutes when LC left the room. Mom knows to call RN or LC if she has any questions, concerns or need assistance with latching infant at breast. Parents will continue to do as much STS as possible. Mom made aware of O/P services, breastfeeding support groups, community resources, and our phone # for post-discharge questions.   Maternal Data Formula Feeding for Exclusion: No Has patient been taught Hand Expression?: Yes Does the patient have breastfeeding experience prior to this delivery?: No  Feeding Feeding Type: Breast Fed  LATCH Score Latch: Grasps breast easily, tongue down, lips flanged, rhythmical sucking.  Audible Swallowing: Spontaneous and intermittent  Type of Nipple: Everted at rest and after stimulation  Comfort (Breast/Nipple): Soft / non-tender  Hold (Positioning): Assistance needed to correctly position infant at breast and maintain latch.  LATCH Score: 9  Interventions Interventions: Breast feeding basics reviewed;Assisted with latch;Skin to skin;Adjust position;Breast compression;Breast massage;Support pillows;Hand express;Position options  Lactation Tools Discussed/Used WIC Program: No   Consult Status Consult Status: Follow-up Date: 01/05/20 Follow-up type: In-patient    Danelle Earthly 01/04/2020, 10:40 PM

## 2020-01-05 LAB — CBC
HCT: 32.5 % — ABNORMAL LOW (ref 36.0–46.0)
Hemoglobin: 10.6 g/dL — ABNORMAL LOW (ref 12.0–15.0)
MCH: 32.4 pg (ref 26.0–34.0)
MCHC: 32.6 g/dL (ref 30.0–36.0)
MCV: 99.4 fL (ref 80.0–100.0)
Platelets: 236 10*3/uL (ref 150–400)
RBC: 3.27 MIL/uL — ABNORMAL LOW (ref 3.87–5.11)
RDW: 12.9 % (ref 11.5–15.5)
WBC: 15.8 10*3/uL — ABNORMAL HIGH (ref 4.0–10.5)
nRBC: 0 % (ref 0.0–0.2)

## 2020-01-05 MED ORDER — IBUPROFEN 800 MG PO TABS: 800.0000 mg | ORAL_TABLET | Freq: Three times a day (TID) | ORAL | 0 refills | Status: AC

## 2020-01-05 NOTE — Lactation Note (Signed)
This note was copied from a baby's chart. Lactation Consultation Note  Patient Name: Nichole Scott [id number] Date: 01/05/2020 Reason for consult: Follow-up assessment  [name] whose infant is now 20 hours old.  This is a term baby at 41+0 weeks.  Mother feels like latching is going well, however, baby remains sleepy at times.  She denies pain with latching.  Discussed nutritive suck vs non-nutritive sucking since baby has had some longer feedings.  Mother knows to gently stimulate baby when she becomes sleepy at the breast.  Mother will continue to feed on cue or at least 8-12 times/24 hours.  She will call for latch assistance as needed.  Mother feeds STS and I encouraged her to continue feeding this way until baby is breast feeding well.  Mother has a DEBP for home use.  She desires a late discharge today if possible.  RN updated and aware.   Maternal Data    Feeding    LATCH Score                   Interventions    Lactation Tools Discussed/Used     Consult Status Consult Status: Follow-up Date: 01/06/20 Follow-up type: In-patient    Dora Sims 01/05/2020, 3:31 PM

## 2020-01-05 NOTE — Discharge Summary (Signed)
Postpartum Discharge Summary  Date of Service updated      Patient Name: Nichole Scott DOB: 10-23-1990 MRN: [id number]  Date of admission: 01/04/2020 Delivery date:01/04/2020  Delivering provider: Bobbye Charleston  Date of discharge: 01/05/2020  Admitting diagnosis: Post term pregnancy over 40 weeks [O48.0] Intrauterine pregnancy: [redacted]w[redacted]d    Secondary diagnosis:  Active Problems:   Post term pregnancy over 40 weeks  Additional problems: none    Discharge diagnosis: Term Pregnancy Delivered                                              Post partum procedures:none Augmentation: AROM and Pitocin Complications: None  Hospital course: Induction of Labor With Vaginal Delivery   29y.o. yo G1P1001 at 456w0das admitted to the hospital 01/04/2020 for induction of labor.  Indication for induction: Postdates.  Patient had an uncomplicated labor course as follows: Membrane Rupture Time/Date: 9:55 AM ,01/04/2020   Delivery Method:Vaginal, Spontaneous  Episiotomy: None  Lacerations:  1st degree;Labial;2nd degree;Perineal  Details of delivery can be found in separate delivery note.  Patient had a routine postpartum course. Patient is discharged home 01/05/20.  Newborn Data: Birth date:01/04/2020  Birth time:6:22 PM  Gender:Female  Living status:Living  Apgars:7 ,9  [phone]   Magnesium Sulfate received: No BMZ received: No Rhophylac:No MMR:No T-DaP:Given prenatally Flu: No Transfusion:No  Physical exam  Vitals:   01/05/20 0236 01/05/20 0558 01/05/20 0944 01/05/20 1400  BP: (!) 102/63 106/66 107/73 108/69  Pulse: 79 88 85 79  Resp: 18 16 18 19   Temp: 98.2 F (36.8 C) 98.7 F (37.1 C) 98 F (36.7 C) 98.2 F (36.8 C)  TempSrc: Oral Oral Oral   SpO2: 100% 100%    Weight:      Height:        Labs: Lab Results  Component Value Date   WBC 15.8 (H) 01/05/2020   HGB 10.6 (L) 01/05/2020   HCT 32.5 (L) 01/05/2020   MCV 99.4 01/05/2020   PLT 236 01/05/2020   CMP  Latest Ref Rng & Units 06/16/2016  Glucose 65 - 99 mg/dL 107(H)  BUN 6 - 20 mg/dL 9  Creatinine 0.44 - 1.00 mg/dL 0.58  Sodium 135 - 145 mmol/L 138  Potassium 3.5 - 5.1 mmol/L 3.8  Chloride 101 - 111 mmol/L 107  CO2 22 - 32 mmol/L 24  Calcium 8.9 - 10.3 mg/dL 9.1  Total Protein 6.5 - 8.1 g/dL 7.1  Total Bilirubin 0.3 - 1.2 mg/dL 0.6  Alkaline Phos 38 - 126 U/L 50  AST 15 - 41 U/L 18  ALT 14 - 54 U/L 16   Edinburgh Score: Edinburgh Postnatal Depression Scale Screening Tool 01/05/2020  I have been able to laugh and see the funny side of things. 0  I have looked forward with enjoyment to things. 0  I have blamed myself unnecessarily when things went wrong. 1  I have been anxious or worried for no good reason. 1  I have felt scared or panicky for no good reason. 1  Things have been getting on top of me. 1  I have been so unhappy that I have had difficulty sleeping. 0  I have felt sad or miserable. 0  I have been so unhappy that I have been crying. 0  The thought of harming myself has occurred to me. 0  Edinburgh Postnatal Depression Scale Total 4      After visit meds:  Allergies as of 01/05/2020   No Known Allergies     Medication List    TAKE these medications   acetaminophen 500 MG tablet Commonly known as: TYLENOL Take 1,000 mg by mouth every 6 (six) hours as needed for mild pain.   cetirizine 10 MG tablet Commonly known as: ZYRTEC Take 10 mg by mouth daily.   esomeprazole 20 MG capsule Commonly known as: NEXIUM Take 20 mg by mouth daily.   fluticasone 50 MCG/ACT nasal spray Commonly known as: FLONASE Place 1-2 sprays into both nostrils daily.   ibuprofen 800 MG tablet Commonly known as: ADVIL Take 1 tablet (800 mg total) by mouth every 8 (eight) hours.   PRENATAL PO Take 1 tablet by mouth daily.            Discharge Care Instructions  (From admission, onward)         Start     Ordered   01/05/20 0000  Discharge wound care:       Comments: Sitz  baths and icepacks to perineum.  If stitches, they will dissolve.   01/05/20 1631           Discharge home in stable condition Infant Feeding:  ? Infant Disposition:home with mother Discharge instruction: per After Visit Summary and Postpartum booklet. Activity: Advance as tolerated. Pelvic rest for 6 weeks.  Diet: routine diet Anticipated Birth Control: Unsure Postpartum Appointment:4 weeks Additional Postpartum F/U:   Future Appointments:No future appointments. Follow up Visit:  Follow-up Information    Bobbye Charleston, MD Follow up in 4 week(s).   Specialty: Obstetrics and Gynecology Contact information: [address] [place] [address] [phone]                   01/05/2020 Daria Pastures, MD

## 2020-01-05 NOTE — Anesthesia Postprocedure Evaluation (Signed)
Anesthesia Post Note  Patient: Nichole Scott  Procedure(s) Performed: AN AD HOC LABOR EPIDURAL     Patient location during evaluation: Mother Baby Anesthesia Type: Epidural Level of consciousness: awake and alert, oriented and patient cooperative Pain management: pain level controlled Vital Signs Assessment: post-procedure vital signs reviewed and stable Respiratory status: spontaneous breathing Cardiovascular status: stable Postop Assessment: no headache, epidural receding, patient able to bend at knees and no signs of nausea or vomiting Anesthetic complications: no Comments: Pt. States she is walking.  Pain score 2.    No complications documented.  Last Vitals:  Vitals:   01/05/20 0236 01/05/20 0558  BP: (!) 102/63 106/66  Pulse: 79 88  Resp: 18 16  Temp: 36.8 C 37.1 C  SpO2: 100% 100%    Last Pain:  Vitals:   01/05/20 0558  TempSrc: Oral  PainSc: 2    Pain Goal: Patients Stated Pain Goal: 2 (01/05/20 0558)                 Merrilyn Puma

## 2020-01-05 NOTE — Clinical Social Work Maternal (Addendum)
CLINICAL SOCIAL WORK MATERNAL/CHILD NOTE  Patient Details  Name: Nichole Scott MRN: [id number] Date of Birth: 08/23/1990  Date:  01/05/2020  Clinical Social Worker Initiating Note:  Lynise Porr, MSW, LCSW Date/Time: Initiated:  01/05/20/1550     Child's Name:  Nichole Scott   Biological Parents:  Mother, Father (FOB, Nichole Scott, 06/11/1984, [phone])   Need for Interpreter:  None   Reason for Referral:  Behavioral Health Concerns, Current Substance Use/Substance Use During Pregnancy    Address:  [address] [place] [address]    Phone number:  [phone] (home)     Additional phone number: none stated  Household Members/Support Persons (HM/SP):   Household Member/Support Person 1   HM/SP Name Relationship DOB or Age  HM/SP -1 Nichole Scott FOB 06-11-1984  HM/SP -2        HM/SP -3        HM/SP -4        HM/SP -5        HM/SP -6        HM/SP -7        HM/SP -8          Natural Supports (not living in the home):  Parent, Extended Family, Friends, Immediate Family   Professional Supports:     Employment: Full-time   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- [employer]   Education:  College graduate   Homebound arranged:    Financial Resources:  Private Insurance   Other Resources:      Cultural/Religious Considerations Which May Impact Care: none stated  Strengths:  Ability to meet basic needs , Pediatrician chosen, Home prepared for child , Understanding of illness   Psychotropic Medications:         Pediatrician:    [place]  Pediatrician List:    [facility] [place] (Peds)  [place]    [place]    [place]    [place]    [place]      Pediatrician Fax Number:    Risk Factors/Current Problems:  None   Cognitive State:  Able to Concentrate , Alert , Linear Thinking    Mood/Affect:  Interested , Comfortable , Calm , Happy    CSW Assessment: CSW received consult for  drug exposed infant and hx of anxiety and depression.  CSW met with [name] at bedside to offer support and complete assessment. On arrival, CSW introduced self and stated reason for visit. FOB and infant Nichole Scott were present, however, after PPD/A and SIDS education, FOB stepped out of room to offer [name] privacy during assessment. [name] and FOB were pleasant and engaged.  CSW provided education regarding the baby blues period vs. perinatal mood disorders, discussed treatment and gave resources for mental health follow up if concerns arise.  CSW recommends self-evaluation during the postpartum time period using the New Mom Checklist from Postpartum Progress and encouraged [name] and FOB to contact a medical professional if symptoms are noted at any time. Both agreed, stated understanding, and denied any questions.   CSW provided review of Sudden Infant Death Syndrome (SIDS) precautions. [name] and FOB stated understanding and denied any questions. [name] confirmed having all needed items for baby including car seat and crib and bassinet for baby's safe sleep.   During assessment, [name] denied any substance use. [name] stated she has never told anyone she uses marijuana or any other substance. CSW stated understanding and explained hospital's infant drug screening policy, infant's pending UDS and CDS results, and   CPS report if warranted. [name] pleasantly stated understanding and denied any questions.   Regarding mental health hx, [name] reported a hx of depression and anxiety while in college. [name] reported seeing a therapists through [employer] during that time. [name] explained depression and anxiety are situational and have been managed well over the past six years. [name] identified coping skills such as talking about feeling with partner and mom, and negative thought cancellation research. [name] denied any other BH dx, SI, HI, or domestic violence. [name] identified FOB, mom, brother, grandparents, FOB's sister, and friends as support. [name] reported  doing well and declined any additional support or resources.       CSW Plan/Description:  Sudden Infant Death Syndrome (SIDS) Education, Perinatal Mood and Anxiety Disorder (PMADs) Education, CSW Will Continue to Monitor Umbilical Cord Tissue Drug Screen Results and Make Report if Warranted, Hospital Drug Screen Policy Information CSW will continue to monitor UDS results as they are not in yet.   Leara Rawl D. Kapil Petropoulos, MSW, LCSW Clinical Social Worker [phone] 01/05/2020, 4:21 PM 

## 2020-01-05 NOTE — Progress Notes (Signed)
Patient is eating, ambulating, voiding.  Pain control is good.  Vitals:   01/04/20 2101 01/04/20 2219 01/05/20 0236 01/05/20 0558  BP: 117/77 113/80 (!) 102/63 106/66  Pulse: 85 91 79 88  Resp: 16 16 18 16   Temp: 98.8 F (37.1 C) 98.5 F (36.9 C) 98.2 F (36.8 C) 98.7 F (37.1 C)  TempSrc: Oral Oral Oral Oral  SpO2: 100% 100% 100% 100%  Weight:      Height:        Fundus firm Perineum without swelling.  Lab Results  Component Value Date   WBC 15.8 (H) 01/05/2020   HGB 10.6 (L) 01/05/2020   HCT 32.5 (L) 01/05/2020   MCV 99.4 01/05/2020   PLT 236 01/05/2020    --/--/A POS Performed at [facility], [address], [place], [place]  (07/23 1211)/[place]  A/P Post partum day 1.  Routine care.  Expect d/c tomorrow.    11-03-1994
